# Patient Record
Sex: Male | Born: 1995 | Race: White | Hispanic: No | Marital: Single | Smoking: Never smoker
Health system: Southern US, Community
[De-identification: ages and names within clinical notes are randomized; demographics above are authoritative.]

---
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
## 2017-02-26 DIAGNOSIS — F5105 Insomnia due to other mental disorder: Secondary | ICD-10-CM | POA: Diagnosis not present

## 2017-02-26 DIAGNOSIS — F331 Major depressive disorder, recurrent, moderate: Secondary | ICD-10-CM | POA: Diagnosis not present

## 2017-02-26 DIAGNOSIS — F900 Attention-deficit hyperactivity disorder, predominantly inattentive type: Secondary | ICD-10-CM | POA: Diagnosis not present

## 2017-02-26 DIAGNOSIS — F411 Generalized anxiety disorder: Secondary | ICD-10-CM | POA: Diagnosis not present

## 2017-02-26 DIAGNOSIS — F4011 Social phobia, generalized: Secondary | ICD-10-CM | POA: Diagnosis not present

## 2017-03-03 DIAGNOSIS — F4011 Social phobia, generalized: Secondary | ICD-10-CM | POA: Diagnosis not present

## 2017-03-03 DIAGNOSIS — F5105 Insomnia due to other mental disorder: Secondary | ICD-10-CM | POA: Diagnosis not present

## 2017-03-03 DIAGNOSIS — F39 Unspecified mood [affective] disorder: Secondary | ICD-10-CM | POA: Diagnosis not present

## 2017-03-03 DIAGNOSIS — F1220 Cannabis dependence, uncomplicated: Secondary | ICD-10-CM | POA: Diagnosis not present

## 2017-03-04 DIAGNOSIS — Z79899 Other long term (current) drug therapy: Secondary | ICD-10-CM | POA: Diagnosis not present

## 2017-03-05 DIAGNOSIS — F411 Generalized anxiety disorder: Secondary | ICD-10-CM | POA: Diagnosis not present

## 2017-03-07 DIAGNOSIS — F4011 Social phobia, generalized: Secondary | ICD-10-CM | POA: Diagnosis not present

## 2017-03-07 DIAGNOSIS — F39 Unspecified mood [affective] disorder: Secondary | ICD-10-CM | POA: Diagnosis not present

## 2017-03-07 DIAGNOSIS — F5105 Insomnia due to other mental disorder: Secondary | ICD-10-CM | POA: Diagnosis not present

## 2017-03-07 DIAGNOSIS — F900 Attention-deficit hyperactivity disorder, predominantly inattentive type: Secondary | ICD-10-CM | POA: Diagnosis not present

## 2017-03-12 DIAGNOSIS — F411 Generalized anxiety disorder: Secondary | ICD-10-CM | POA: Diagnosis not present

## 2017-03-13 DIAGNOSIS — F900 Attention-deficit hyperactivity disorder, predominantly inattentive type: Secondary | ICD-10-CM | POA: Diagnosis not present

## 2017-03-13 DIAGNOSIS — F5105 Insomnia due to other mental disorder: Secondary | ICD-10-CM | POA: Diagnosis not present

## 2017-03-13 DIAGNOSIS — F39 Unspecified mood [affective] disorder: Secondary | ICD-10-CM | POA: Diagnosis not present

## 2017-03-13 DIAGNOSIS — F1220 Cannabis dependence, uncomplicated: Secondary | ICD-10-CM | POA: Diagnosis not present

## 2017-03-14 ENCOUNTER — Telehealth (HOSPITAL_COMMUNITY): Payer: Self-pay | Admitting: Psychology

## 2017-03-19 DIAGNOSIS — F411 Generalized anxiety disorder: Secondary | ICD-10-CM | POA: Diagnosis not present

## 2017-03-21 ENCOUNTER — Encounter (HOSPITAL_COMMUNITY): Payer: Self-pay | Admitting: Psychology

## 2017-03-21 ENCOUNTER — Ambulatory Visit (INDEPENDENT_AMBULATORY_CARE_PROVIDER_SITE_OTHER): Payer: BLUE CROSS/BLUE SHIELD | Admitting: Psychology

## 2017-03-21 DIAGNOSIS — F1220 Cannabis dependence, uncomplicated: Secondary | ICD-10-CM

## 2017-03-21 NOTE — Progress Notes (Addendum)
Comprehensive Clinical Assessment (CCA) Note  03/21/2017 Stephen Mcgee [id number]  Visit Diagnosis:  Cannabis Use Disorder, Moderate Mood Disorder,    CCA Part One  Part One has been completed on paper by the patient.  (See scanned document in Chart Review)  CCA Part Two A  Intake/Chief Complaint:  CCA Intake With Chief Complaint CCA Part Two Date: 03/21/17 CCA Part Two Time: 1057 Chief Complaint/Presenting Problem: I started smoking a lot of marijuana in college last year. My use increased in the spring and I basically flunked out of the spring semester. My parents are very upset and worried. I have been seeing Dr. Maryruth Bun at [facility] and meet weekly with an individual counselor. I haven't smoked since February 15 and I am feeling better and sleeping much better. MY parents are afraid I am going to go back to smoking. Patients Currently Reported Symptoms/Problems: I stopped doing a lot of things that I used to do, including working out at [employer] and playing lacrosse. Of course, my grades were pretty bad and I didn't apply myself. Today I am feeling better. I still have depression and sometimes get anxious, but my counselor helps me learn how to handle things better.  Collateral Involvement: The patient signed a consent allowing me to speak with his parents.  Individual's Strengths: Physically healthy, has family support, seems motivated.  Individual's Preferences: to be educated and learn more about learning to stay healthy.  Individual's Abilities: Patient has good social skills, is able to articulate his feelings Type of Services Patient Feels Are Needed: he is receptive to any recommendations Initial Clinical Notes/Concerns: Patient admitted he has always had problems with lying, "it comes second nature to me". Patient is adopted and was told his biological father had an "addictive personality"  Mental Health Symptoms Depression:  Depression: Worthlessness, Hopelessness,  Difficulty Concentrating, Change in energy/activity  Mania:  Mania: N/A  Anxiety:   Anxiety: Worrying, Irritability, Restlessness, Difficulty concentrating  Psychosis:  Psychosis: N/A  Trauma:  Trauma: N/A  Obsessions:  Obsessions: N/A  Compulsions:  Compulsions: N/A  Inattention:  Inattention: N/A  Hyperactivity/Impulsivity:  Hyperactivity/Impulsivity: N/A  Oppositional/Defiant Behaviors:  Oppositional/Defiant Behaviors: N/A  Borderline Personality:  Emotional Irregularity: N/A  Other Mood/Personality Symptoms:      Mental Status Exam Appearance and self-care  Stature:  Stature: Average  Weight:  Weight: Thin  Clothing:  Clothing: Casual  Grooming:  Grooming: Normal  Cosmetic use:  Cosmetic Use: Age appropriate  Posture/gait:  Posture/Gait: Normal  Motor activity:  Motor Activity: Not Remarkable  Sensorium  Attention:  Attention: Normal  Concentration:  Concentration: Normal  Orientation:  Orientation: X5  Recall/memory:  Recall/Memory: Normal  Affect and Mood  Affect:  Affect: Appropriate  Mood:     Relating  Eye contact:  Eye Contact: Normal  Facial expression:  Facial Expression: Responsive  Attitude toward examiner:  Attitude Toward Examiner: Cooperative  Thought and Language  Speech flow: Speech Flow: Normal  Thought content:  Thought Content: Appropriate to mood and circumstances  Preoccupation:     Hallucinations:     Organization:     Company secretary of Knowledge:  Fund of Knowledge: Average  Intelligence:  Intelligence: Average  Abstraction:  Abstraction: Normal  Judgement:  Judgement: Fair  Dance movement psychotherapist:  Reality Testing: Realistic  Insight:  Insight: Fair  Decision Making:  Decision Making: Impulsive  Social Functioning  Social Maturity:  Social Maturity: Impulsive, Isolates  Social Judgement:  Social Judgement: "Chief of Staff", Heedless  Stress  Stressors:  Stressors: Family conflict, Work  Coping Ability:  Coping Ability: Deficient  supports  Skill Deficits:     Supports:      Family and Psychosocial History: Family history Marital status: Single Are you sexually active?: Yes What is your sexual orientation?: Heterosexual Has your sexual activity been affected by drugs, alcohol, medication, or emotional stress?: no Does patient have children?: No  Childhood History:  Childhood History By whom was/is the patient raised?: Adoptive parents Additional childhood history information: I wazs adopted at birth. I was born in [place]. My parents adopted me and brought me to [facility] They were told my birth father had an 'addictive personality' Description of patient's relationship with caregiver when they were a child: I had a great childhood - I had everything I could want Patient's description of current relationship with people who raised him/her: We have a good relationship now. They are concerned I will go back to using.  How were you disciplined when you got in trouble as a child/adolescent?: Usually the threat of a spanking was enough for me to behave. I was punished appropriately Does patient have siblings?: Yes Number of Siblings: 1 Description of patient's current relationship with siblings: It was good, but she has recently moved out of the house and is smoking pot. I have tAaken my parents side and she has cut me off.  Did patient suffer any verbal/emotional/physical/sexual abuse as a child?: No Did patient suffer from severe childhood neglect?: No Has patient ever been sexually abused/assaulted/raped as an adolescent or adult?: No Was the patient ever a victim of a crime or a disaster?: No Witnessed domestic violence?: No Has patient been effected by domestic violence as an adult?: No  CCA Part Two B  Employment/Work Situation: Employment / Work Psychologist, occupational Employment situation: Biomedical scientist job has been impacted by current illness: Yes Describe how patient's job has been impacted: Patient has not sought  employment because he couldn't pass a drug test. He will seek a job once the [facility] has cleared What is the longest time patient has a held a job?: two months  Where was the patient employed at that time?: [employer] Has patient ever been in the [employer]?: No  Education: Engineer, civil (consulting) Currently Attending: N/A Last Grade Completed: 13 Name of High School: [employer] Did Garment/textile technologist From [employer]?: Yes Did You Attend College?: Yes What Type of College Degree Do you Have?: I started at [employer] in the fall of 2017. Only got credit for that first semester because I flunked out the second semester Did You Attend Graduate School?: No What Was Your Major?: computer information systems Did You Have Any Special Interests In School?: no Did You Have An Individualized Education Program (IIEP): No Did You Have Any Difficulty At School?: No  Religion: Religion/Spirituality Are You A Religious Person?: No How Might This Affect Treatment?: But I am kind of spiritual. I do not pray  Leisure/Recreation: Leisure / Recreation Leisure and Hobbies: goingn to the gym, assisting the lacrosse team at my old high school  Exercise/Diet: Exercise/Diet Do You Exercise?: Yes What Type of Exercise Do You Do?: Weight Training, Run/Walk How Many Times a Week Do You Exercise?: 4-5 times a week Have You Gained or Lost A Significant Amount of Weight in the Past Six Months?: No Do You Follow a Special Diet?: No Do You Have Any Trouble Sleeping?: No  CCA Part Two C  Alcohol/Drug Use: Alcohol / Drug Use Pain Medications: N/A Prescriptions: Right  now I am only taking Abilify 10 mg Over the Counter: N/A History of alcohol / drug use?: Yes Longest period of sobriety (when/how long): I was drug free for a month early last summer Negative Consequences of Use: Personal relationships, Work / Programmer, multimedia, Surveyor, quantity Withdrawal Symptoms: Irritability Substance #1 Name of Substance 1: Marijuana 1 - Age of First  Use: 19 1 - Amount (size/oz): over a gram  1 - Frequency: daily 1 - Duration: for a solid year 1 - Last Use / Amount: 03/08/17 couple of hits Substance #2 Name of Substance 2: alcohol 2 - Age of First Use: 19 2 - Amount (size/oz): one or two drinks 2 - Frequency: 1-2 times a month 2 - Duration: about a year 2 - Last Use / Amount: one Christiane Ha and coke on Tuesday evening - at home with dinner. My parents let me have a drink every so often Substance #3 Name of Substance 3: cocaine 3 - Age of First Use: 20 3 - Amount (size/oz): one line of powder 3 - Frequency: I tried it twice when I first got to [employer] in the fall of 2017 3 - Duration: only twice 3 - Last Use / Amount: Fall of 2017                CCA Part Three  ASAM's:  Six Dimensions of Multidimensional Assessment  Dimension 1:  Acute Intoxication and/or Withdrawal Potential:  Dimension 1:  Comments: the patient was smoking heavily last year, but has reduced his use considerably. Patient reports last use was 13 days ago.   Dimension 2:  Biomedical Conditions and Complications:  Dimension 2:  Comments: good physical health  Dimension 3:  Emotional, Behavioral, or Cognitive Conditions and Complications:  Dimension 3:  Comments: Patient is diagnosed with mood disorder and anxiety. He will be meeting with psychiatrist within the week to discuss med management  Dimension 4:  Readiness to Change:  Dimension 4:  Comments: Patient is here because of his parents. His motivation is somewhat questionable  Dimension 5:  Relapse, Continued use, or Continued Problem Potential:  Dimension 5:  Comments: The likelihood for relapse is quite high  Dimension 6:  Recovery/Living Environment:  Dimension 6:  Recovery/Living Environment Comments: Patient lives at home with parents, but they allow him to drink alcohol despite being just 22 yo   Substance use Disorder (SUD) Substance Use Disorder (SUD)  Checklist Symptoms of Substance Use: Continued  use despite having a persistent/recurrent physical/psychological problem caused/exacerbated by use, Continued use despite persistent or recurrent social, interpersonal problems, caused or exacerbated by use, Large amounts of time spent to obtain, use or recover from the substance(s), Recurrent use that results in a fialure to fulfill major rule obligatinos (work, school, home), Social, occupational, recreational activities given up or reduced due to use, Substance(s) often taken in large amounts or over longer times than was intended  Social Function:  Social Functioning Social Maturity: Impulsive, Isolates Social Judgement: "Chief of Staff", Heedless  Stress:  Stress Stressors: Family conflict, Work Coping Ability: Deficient supports Patient Takes Medications The Biochemist, clinical Instructed?: Yes Priority Risk: Low Acuity  Risk Assessment- Self-Harm Potential: Risk Assessment For Self-Harm Potential Thoughts of Self-Harm: No current thoughts Method: No plan Availability of Means: No access/NA Additional Comments for Self-Harm Potential: No history of self-harm  Risk Assessment -Dangerous to Others Potential: Risk Assessment For Dangerous to Others Potential Method: No Plan Availability of Means: No access or NA Intent: Vague intent or NA Notification  Required: No need or identified person Additional Comments for Danger to Others Potential: Not a violent person  DSM5 Diagnoses: There are no active problems to display for this patient.   Patient Centered Plan: Patient is on the following Treatment Plan(s): Attend Aftercare - if continued use, enter CD-IOP.  Recommendations for Services/Supports/Treatments: The patient is currently meeting for weekly individual counseling sessions and being monitored by his psychiatrist, Dr. Maryruth B[place]Kapur, in [place].   Treatment Plan Summary:    Referrals to Alternative Service(s): Referred to Alternative Service(s):   Place:   Date:   Time:     Referred to Alternative Service(s):   Place:   Date:   Time:    Referred to Alternative Service(s):   Place:   Date:   Time:    Referred to Alternative Service(s):   Place:   Date:   Time:     Charmian Muffnn Dalbert Stillings

## 2017-03-25 ENCOUNTER — Telehealth (HOSPITAL_COMMUNITY): Payer: Self-pay | Admitting: Psychology

## 2017-03-25 DIAGNOSIS — F5105 Insomnia due to other mental disorder: Secondary | ICD-10-CM | POA: Diagnosis not present

## 2017-03-25 DIAGNOSIS — F900 Attention-deficit hyperactivity disorder, predominantly inattentive type: Secondary | ICD-10-CM | POA: Diagnosis not present

## 2017-03-25 DIAGNOSIS — F39 Unspecified mood [affective] disorder: Secondary | ICD-10-CM | POA: Diagnosis not present

## 2017-03-25 DIAGNOSIS — F4011 Social phobia, generalized: Secondary | ICD-10-CM | POA: Diagnosis not present

## 2017-03-26 DIAGNOSIS — F411 Generalized anxiety disorder: Secondary | ICD-10-CM | POA: Diagnosis not present

## 2017-03-27 ENCOUNTER — Other Ambulatory Visit (HOSPITAL_COMMUNITY): Payer: BLUE CROSS/BLUE SHIELD | Attending: Psychiatry | Admitting: Psychology

## 2017-03-27 ENCOUNTER — Encounter: Payer: Self-pay | Admitting: Medical

## 2017-03-27 ENCOUNTER — Encounter (HOSPITAL_COMMUNITY): Payer: Self-pay | Admitting: Psychology

## 2017-03-27 VITALS — BP 110/70 | HR 72 | Ht 72.0 in | Wt 170.0 lb

## 2017-03-27 DIAGNOSIS — F341 Dysthymic disorder: Secondary | ICD-10-CM

## 2017-03-27 DIAGNOSIS — F1220 Cannabis dependence, uncomplicated: Secondary | ICD-10-CM | POA: Diagnosis not present

## 2017-03-27 DIAGNOSIS — F428 Other obsessive-compulsive disorder: Secondary | ICD-10-CM

## 2017-03-27 DIAGNOSIS — Z6372 Alcoholism and drug addiction in family: Secondary | ICD-10-CM | POA: Diagnosis not present

## 2017-03-27 DIAGNOSIS — F4320 Adjustment disorder, unspecified: Secondary | ICD-10-CM | POA: Diagnosis not present
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
## 2017-03-27 NOTE — Progress Notes (Signed)
    Daily Group Progress Note  Program: CD-IOP   03/27/2017 Blenda Nicely [id number]  Diagnosis:  Cannabis use disorder, moderate, dependence ([place])  Dysthymia  Adjustment disorder with problems at school  Alcoholism and drug addiction in family  Other obsessive-compulsive disorders - LyingHx of alcoholism/addiction in family([place] Adult Children (Grandchildren) of Alcoholics)Adult children lie when it is just as easy to tell the truth   Sobriety Date: 03/14/17  Group Time: 1-2:30pm  Participation Level: Active  Behavioral Response: Appropriate  Type of Therapy: Process Group  Interventions: Supportive  Topic: Process: The first half of group was spent in process. Members shared about their week since we last met. Their check-ins included any speed bumps or shining moments along with the number of recovery-based meetings they had attended. A new group member was present and he introduced himself to the group in this half of the session. The medical director met with the new member and the member graduating successfully from group tomorrow. Random drug tests were collected from three members.   Group Time: 2:30-4pm  Participation Level: Active  Behavioral Response: Sharing  Type of Therapy: Psycho-Education  Interventions: Strength-based  Topic: Psycho-Ed: The Neurobiology of Addiction. The second half of group was spent in a psycho-ed on the neurobiology of addiction. A brief slide show was presented and then clips from the documentary, "Pleasure Unwoven". A discussion ensued with members providing feedback on the concept of a 'maladaptive coping mechanism'. The session was informative and members responded favorably to the new information and insight the presentation provided them.   Summary: The patient was new to the group and attentive, but very quiet for much of the process session. As this half of group neared the end, the patient was asked to shared about himself.  He identified his sobriety date as 2/21 and went on to describe his growing use of cannabis once he moved to [place] and started college at [employer].  The patient downplayed his drug use and emphasized the anxiety and depression that compelled him to use. The patient received a warm welcome from the group members. In the psycho-ed, the patient met with the medical director. Upon his return to group, he was reserved, but attentive. As the session closed, the patient assured the counselor he would return for the next group session. The patient is young and very new to recovery. He responded well to this first intervention.   UDS collected: Yes, negative  AA/NA attended? No  Sponsor?: No   Brandon Melnick, LCAS 03/27/2017 9:11 PM

## 2017-03-28 ENCOUNTER — Other Ambulatory Visit (HOSPITAL_COMMUNITY): Payer: BLUE CROSS/BLUE SHIELD

## 2017-03-28 ENCOUNTER — Encounter (HOSPITAL_COMMUNITY): Payer: Self-pay | Admitting: Psychology

## 2017-03-28 NOTE — Progress Notes (Signed)
Psychiatric Initial Adult Assessment   Patient Identification: Stephen Mcgee MRN:  [id number] Date of Evaluation: 03/27/2017 3:00 pm Referral Source: Dr Cephus Shelling  MD Psychiatry Chief Complaint:   Chief Complaint    Addiction Problem; Depression; Drug Problem     Visit Diagnosis:    ICD-10-CM   1. Cannabis use disorder, moderate, dependence (HCC) F12.20   2. Dysthymia F34.1   3. Adjustment disorder with problems at school F43.20   4. Alcoholism and drug addiction in family Z52.72   5. Other obsessive-compulsive disorders F42.8    Lying Hx of alcoholism/addiction in family([place] Adult Children (Grandchildren) of Alcoholics) Adult children lie when it is just as easy to tell the truth    History of Present Illness:  22 y/o, adopted at birth [facility] whose biological father is known to him to have an "addictive personality" and whose adoptive family has history of alcohol and  nicotene  dependence, referred by his Psychiatrist Cephus Shelling MD for Cannabis abuse with SUD Severe Dependence by DSM 5 criteria.(6/11 + Criteria)  His problem came to light when he flunked out [employer] his second semester there.Prior to going to [place], on a trip to [place] in 2018  pt had a panic attack and reports was hospitalized (?ED) there before returning home early.  He saw Dr Nicolasa Ducking in [place] and was being treated for Insomnia, ADD, Social phobia ,Mood disorder ,and  Cannabis dependence .With the revelation of his heavy Cannabis use Dr Nicolasa Ducking discontinued his Adderall and started him on Celexa 40 mg ,Abilify 52m (Increased to 10 mg) along with Buspar. Celexa caused worsening mood and he is tapering off of this and beginning Lamictal titer today.He has stopped taking Buspar as well he says.He is having no side effects from the Abilify. He c/o irritability;isolation and loss of interest in usual pleasures.He is having arguments with his parents who are very concerned. Pt met with Counselor  03/21/2017 to discuss treatment options.  His Cannabis and drug use are recorded below in the SA Chart.He reports his only withdrawal symptom is ongoing night sweats that awaken him but he is easily able to go back to sleep.He says he actually feels better not smoking "weed" He is having no problems with the idea of treatment or with his diagnosis of dependence.   Associated Signs/Symptoms: DSM 5 SUD criteria + 6/11 = Severe Dependence AUDIT C score 2 Depression Symptoms:   depressed mood, anhedonia, psychomotor retardation, difficulty concentrating, PHQ 9 score 7 mild depression (Hypo) Manic Symptoms: Substance related  Distractibility, Irritable Mood, Anxiety Symptoms:   Agoraphobia,No Excessive Worry,Yes Panic Symptoms,In past Obsessive Compulsive Symptoms:   None,, Social Anxiety,No Specific Phobias, Psychotic Symptoms:  None PTSD Symptoms: Negative  Past Psychiatric History: ADD                                                 Anxiety with panic                                                 MDD  Previous Psychotropic Medications: Yes Adderall;Celexa;Abilify-started Lamictal titer today  Substance Abuse History in the last 12 months:   Substance Abuse History in the last 12 months:  Substance Age of 1st Use Last Use Amount Specific Type  Nicotine      Alcohol      Cannabis      Opiates      Cocaine      Methamphetamines      LSD      Ecstasy      Benzodiazepines      Caffeine      Inhalants      Others:                          Consequences of Substance Abuse: Medical Consequences:  Anxiety/panic Legal Consequences:  NA-unable to get Employment due to inability to pass preemployment UDS Family Consequences:  Dysfunctional and at timed contentious relationship with parents especially Mother Withdrawal Symptoms:   Diaphoresis  Past Medical History:  ([facility]) . Asthma without status asthmaticus  mild, no inhaler use since 03/2013  . Clavicle fracture, shaft  11/07/2013  left  . Concussion  mild, x2, 7th, 9th grade from lacrosse  . Immunizations up to date   Past Surgical History: ([facility]) Cyst removal from top of mouth     OPEN REDUCTION CLAVICULAR FRACTURE 11/16/2013 Left Procedure: OPEN REDUCTION CLAVICULAR FRACTURE; Surgeon: Opal Sidles III, MD; Location: [place]; Service: Orthopedics; Laterality: Left;  REMOVAL HARDWARE SHOULDER 02/15/2014 Left Procedure: REMOVAL HARDWARE CLAVICLE-Depuy pin ; Surgeon: Kristine Garbe, MD; Location[facility]; Service: Orthopedics; Laterality: Left;    Family Psychiatric History:Biological Father-hx of addiction;Adoptive MGGP hx of alcoholism,MGF Nicotene Dependence  Family History: Adpoted  Social History:   Social History   Socioeconomic History  . Marital status: Single    Spouse name: None  . Number of children: None  . Years of education: 71..5  . Highest education level: 1 semester of [employer]  . Financial resource strain: None  . Food insecurity - worry: None  . Food insecurity - inability: None  . Transportation needs - medical: None  . Transportation needs - non-medical: None  Occupational History  . None  Tobacco Use  . Smoking status: Pot-quit 03/19/2017  . Smokeless tobacco: Never Used  Substance and Sexual Activity  . Alcohol use: 1-2x/month  . Drug use: Cannabis;Cocaine   . Sexual activity: Not asked  Other Topics Concern  . None  Social History Narrative From [facility]  .  Family and Psychosocial History: Family history Marital status: Single Are you sexually active?: Yes What is your sexual orientation?: Heterosexual Has your sexual activity been affected by drugs, alcohol, medication, or emotional stress?: no Does patient have children?: No  Childhood History:  Childhood History By whom was/is the patient raised?: Adoptive parents Additional childhood history information: I wazs adopted at birth. I was born in [place]. My parents adopted me  and brought me to [facility]. They were told my birth father had an 'addictive personality' Description of patient's relationship with caregiver when they were a child: I had a great childhood - I had everything I could want Patient's description of current relationship with people who raised him/her: We have a good relationship now. They are concerned I will go back to using.  How were you disciplined when you got in trouble as a child/adolescent?: Usually the threat of a spanking was enough for me to behave. I was punished appropriately Does patient have siblings?: Yes Number of Siblings: 1 Description of patient's current relationship with siblings: It was good, but she has recently moved out of  the house and is smoking pot. I have tAaken my parents side and she has cut me off.  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 He will seek a job once the [facility] has cleared What is the longest time patient has a held a job?: two months  Where was the patient employed at that time?: [employer] Has patient ever been in the [employer]?: No  Education: Museum/gallery curator Currently Attending: N/A Last Grade Completed: 13 Name of [place]: [place] Did Teacher, adult education From [employer]?: Yes Did [employer]?: Yes What Type of College Degree Do you Have?: I started  at [employer] in the fall of 2017. Only got credit for that first semester because I flunked out the second semester Did [employer]?: No What Was Your Major?: computer information systems Did You Have Any Special Interests In School?: no Did You Have An Individualized Education Program (IIEP): No Did You Have Any Difficulty At School?: No  Religion: Religion/Spirituality Are You A Religious Person?: No How Might This Affect Treatment?: But I am kind of spiritual. I do not pray  Leisure/Recreation: Leisure / Recreation Leisure and Hobbies: goingn to the gym, assisting the lacrosse team at my old high school  Exercise/Diet: Exercise/Diet Do You Exercise?: Yes What Type of Exercise Do You Do?: Weight Training, Run/Walk How Many Times a Week Do You Exercise?: 4-5 times a week Have You Gained or Lost A Significant Amount of Weight in the Past Six Months?: No Do You Follow a Special Diet?: No Do You Have Any Trouble Sleeping?: No     Additional Social History:   Allergies:  Celexa worsened mood disorder  Metabolic Disorder Labs: No results found for: HGBA1C, MPG No results found for: PROLACTIN No results found for: CHOL, TRIG, HDL, CHOLHDL, VLDL, LDLCALC   Current Medications: Current Outpatient Medications  Medication Sig Dispense Refill  . ARIPiprazole (ABILIFY) 10 MG tablet Lamictal 25 mg Take 10 mg by mouth daily. 1 tab week 1, 2 tabs week 2     No current facility-administered medications for this visit.     Neurologic: Headache: Negative Seizure: Negative Paresthesias:Negative  Musculoskeletal: Strength & Muscle Tone: within normal limits Gait & Station: normal Patient leans: N/A  Psychiatric Specialty Exam: Review of Systems  Constitutional: Positive for diaphoresis (Cannabis withdrawal). Negative for chills, fever, malaise/fatigue and weight loss.  HENT: Negative for congestion, ear discharge, ear pain, hearing loss, nosebleeds, sinus pain,  sore throat and tinnitus.   Eyes: Negative for blurred vision, double vision, photophobia, pain, discharge and redness.  Respiratory: Negative for cough, hemoptysis, sputum production, shortness of breath, wheezing and stridor.        Hx of Childhood asthma  Cardiovascular: Negative for chest pain, palpitations, orthopnea, claudication, leg swelling and PND.  Gastrointestinal: Negative for abdominal pain, blood in stool, constipation, diarrhea, heartburn, melena, nausea and vomiting.  Genitourinary: Negative for dysuria, flank pain, frequency and urgency.  Musculoskeletal: Negative for back pain, falls, joint pain, myalgias and neck pain.  Skin: Negative for itching.  Neurological: Negative for dizziness, tingling, tremors, sensory change, speech change, focal weakness, seizures, loss of consciousness, weakness and headaches.  Endo/Heme/Allergies: Negative for environmental allergies and polydipsia. Does not bruise/bleed easily.  Psychiatric/Behavioral: Positive for depression (PHQ 9 + mild) and substance abuse. Negative for hallucinations and suicidal ideas. The patient is nervous/anxious. The patient does not have insomnia.     Blood pressure 110/70, pulse 72, height 6' (1.829 m), weight 170 lb (77.1 kg).Body mass index is 23.06 kg/m.  General Appearance: Casual and Well Groomed  Eye Contact:  Good  Speech:  Clear and Coherent  Volume:  Normal  Mood:  Euthymic  Affect:  Congruent  Thought Process:  Coherent and Descriptions of Associations: Intact  Orientation:  Full (Time, Place, and Person)  Thought Content:  Logical and denies Obsession with Cannabis at present  Suicidal Thoughts:  No  Homicidal Thoughts:  No  Memory:  Negative  Judgement:  Impaired  Insight:  Lacking  Psychomotor Activity:  Normal  Concentration:  Concentration: intact for visit and Attention Span: intact for visit  Recall:  Good  Fund of Knowledge:Fair  Language: Fair  Akathisia:  NA  Handed:  Right  AIMS  (if indicated):  NA  Assets:  Desire for Improvement Financial Resources/Insurance Housing Physical Health Resilience Social Support Transportation  ADL's:  Intact  Cognition: Impaired,  Moderate 13 days s/p last use  Sleep:  Denies problem    Treatment Plan Summary: Treatment Plan/Recommendations:  Plan of Care: CD[facility]OP Cannabis Dependence see Counselor's individualized treatment plan- MAT  Pt will advise if cravings develop  Mood Disorder: per Dr Nicolasa Ducking and monitor in treatment.   Laboratory:  UDS per protocol  Psychotherapy: [facility] IOP Group,Individual and Family  Medications:Per Dr Nicolasa Ducking See list /MAT on hold for now  Routine PRN Medications:  No  Consultations: None at this time  Safety Concerns: None at this time  Other:       Darlyne Russian, PA-C

## 2017-04-01 ENCOUNTER — Other Ambulatory Visit (HOSPITAL_COMMUNITY): Payer: BLUE CROSS/BLUE SHIELD | Admitting: Psychology

## 2017-04-02 DIAGNOSIS — F411 Generalized anxiety disorder: Secondary | ICD-10-CM | POA: Diagnosis not present

## 2017-04-03 ENCOUNTER — Other Ambulatory Visit (HOSPITAL_COMMUNITY): Payer: BLUE CROSS/BLUE SHIELD

## 2017-04-03 ENCOUNTER — Telehealth (HOSPITAL_COMMUNITY): Payer: Self-pay | Admitting: Psychology

## 2017-04-04 ENCOUNTER — Other Ambulatory Visit (HOSPITAL_COMMUNITY): Payer: BLUE CROSS/BLUE SHIELD

## 2017-04-08 ENCOUNTER — Other Ambulatory Visit (HOSPITAL_COMMUNITY): Payer: BLUE CROSS/BLUE SHIELD

## 2017-04-09 ENCOUNTER — Telehealth (HOSPITAL_COMMUNITY): Payer: Self-pay | Admitting: Psychology

## 2017-04-09 DIAGNOSIS — F411 Generalized anxiety disorder: Secondary | ICD-10-CM | POA: Diagnosis not present

## 2017-04-10 ENCOUNTER — Other Ambulatory Visit (HOSPITAL_COMMUNITY): Payer: BLUE CROSS/BLUE SHIELD

## 2017-04-11 ENCOUNTER — Other Ambulatory Visit (HOSPITAL_COMMUNITY): Payer: BLUE CROSS/BLUE SHIELD

## 2017-04-15 ENCOUNTER — Other Ambulatory Visit (HOSPITAL_COMMUNITY): Payer: BLUE CROSS/BLUE SHIELD

## 2017-04-15 DIAGNOSIS — F411 Generalized anxiety disorder: Secondary | ICD-10-CM | POA: Diagnosis not present

## 2017-04-16 DIAGNOSIS — F39 Unspecified mood [affective] disorder: Secondary | ICD-10-CM | POA: Diagnosis not present

## 2017-04-16 DIAGNOSIS — F4011 Social phobia, generalized: Secondary | ICD-10-CM | POA: Diagnosis not present

## 2017-04-16 DIAGNOSIS — F900 Attention-deficit hyperactivity disorder, predominantly inattentive type: Secondary | ICD-10-CM | POA: Diagnosis not present

## 2017-04-16 DIAGNOSIS — F5105 Insomnia due to other mental disorder: Secondary | ICD-10-CM | POA: Diagnosis not present

## 2017-04-17 ENCOUNTER — Other Ambulatory Visit (HOSPITAL_COMMUNITY): Payer: BLUE CROSS/BLUE SHIELD

## 2017-04-18 ENCOUNTER — Other Ambulatory Visit (HOSPITAL_COMMUNITY): Payer: BLUE CROSS/BLUE SHIELD

## 2017-04-22 ENCOUNTER — Other Ambulatory Visit (HOSPITAL_COMMUNITY): Payer: BLUE CROSS/BLUE SHIELD

## 2017-04-24 ENCOUNTER — Other Ambulatory Visit (HOSPITAL_COMMUNITY): Payer: BLUE CROSS/BLUE SHIELD

## 2017-04-24 DIAGNOSIS — F411 Generalized anxiety disorder: Secondary | ICD-10-CM | POA: Diagnosis not present

## 2017-04-25 ENCOUNTER — Other Ambulatory Visit (HOSPITAL_COMMUNITY): Payer: BLUE CROSS/BLUE SHIELD

## 2017-04-29 ENCOUNTER — Other Ambulatory Visit (HOSPITAL_COMMUNITY): Payer: BLUE CROSS/BLUE SHIELD

## 2017-05-01 ENCOUNTER — Other Ambulatory Visit (HOSPITAL_COMMUNITY): Payer: BLUE CROSS/BLUE SHIELD

## 2017-05-01 DIAGNOSIS — F411 Generalized anxiety disorder: Secondary | ICD-10-CM | POA: Diagnosis not present

## 2017-05-02 ENCOUNTER — Other Ambulatory Visit (HOSPITAL_COMMUNITY): Payer: BLUE CROSS/BLUE SHIELD

## 2017-05-06 ENCOUNTER — Other Ambulatory Visit (HOSPITAL_COMMUNITY): Payer: BLUE CROSS/BLUE SHIELD

## 2017-05-08 ENCOUNTER — Other Ambulatory Visit (HOSPITAL_COMMUNITY): Payer: BLUE CROSS/BLUE SHIELD

## 2017-05-08 DIAGNOSIS — F411 Generalized anxiety disorder: Secondary | ICD-10-CM | POA: Diagnosis not present

## 2017-05-09 ENCOUNTER — Other Ambulatory Visit (HOSPITAL_COMMUNITY): Payer: BLUE CROSS/BLUE SHIELD

## 2017-05-13 ENCOUNTER — Other Ambulatory Visit (HOSPITAL_COMMUNITY): Payer: BLUE CROSS/BLUE SHIELD

## 2017-05-15 ENCOUNTER — Other Ambulatory Visit (HOSPITAL_COMMUNITY): Payer: BLUE CROSS/BLUE SHIELD

## 2017-05-15 DIAGNOSIS — F4011 Social phobia, generalized: Secondary | ICD-10-CM | POA: Diagnosis not present

## 2017-05-15 DIAGNOSIS — F331 Major depressive disorder, recurrent, moderate: Secondary | ICD-10-CM | POA: Diagnosis not present

## 2017-05-15 DIAGNOSIS — F900 Attention-deficit hyperactivity disorder, predominantly inattentive type: Secondary | ICD-10-CM | POA: Diagnosis not present

## 2017-05-15 DIAGNOSIS — F5105 Insomnia due to other mental disorder: Secondary | ICD-10-CM | POA: Diagnosis not present

## 2017-05-16 ENCOUNTER — Other Ambulatory Visit (HOSPITAL_COMMUNITY): Payer: BLUE CROSS/BLUE SHIELD

## 2017-05-20 ENCOUNTER — Other Ambulatory Visit (HOSPITAL_COMMUNITY): Payer: BLUE CROSS/BLUE SHIELD

## 2017-05-22 ENCOUNTER — Other Ambulatory Visit (HOSPITAL_COMMUNITY): Payer: BLUE CROSS/BLUE SHIELD

## 2017-05-22 DIAGNOSIS — F411 Generalized anxiety disorder: Secondary | ICD-10-CM | POA: Diagnosis not present

## 2017-05-23 ENCOUNTER — Other Ambulatory Visit (HOSPITAL_COMMUNITY): Payer: BLUE CROSS/BLUE SHIELD

## 2017-05-27 ENCOUNTER — Other Ambulatory Visit (HOSPITAL_COMMUNITY): Payer: BLUE CROSS/BLUE SHIELD

## 2017-05-29 ENCOUNTER — Other Ambulatory Visit (HOSPITAL_COMMUNITY): Payer: BLUE CROSS/BLUE SHIELD

## 2017-05-30 ENCOUNTER — Other Ambulatory Visit (HOSPITAL_COMMUNITY): Payer: BLUE CROSS/BLUE SHIELD

## 2017-06-03 ENCOUNTER — Other Ambulatory Visit (HOSPITAL_COMMUNITY): Payer: BLUE CROSS/BLUE SHIELD

## 2017-06-05 ENCOUNTER — Other Ambulatory Visit (HOSPITAL_COMMUNITY): Payer: BLUE CROSS/BLUE SHIELD

## 2017-06-06 ENCOUNTER — Other Ambulatory Visit (HOSPITAL_COMMUNITY): Payer: BLUE CROSS/BLUE SHIELD

## 2017-06-10 ENCOUNTER — Other Ambulatory Visit (HOSPITAL_COMMUNITY): Payer: BLUE CROSS/BLUE SHIELD

## 2017-06-12 ENCOUNTER — Other Ambulatory Visit (HOSPITAL_COMMUNITY): Payer: BLUE CROSS/BLUE SHIELD

## 2017-06-12 DIAGNOSIS — F411 Generalized anxiety disorder: Secondary | ICD-10-CM | POA: Diagnosis not present

## 2017-06-12 DIAGNOSIS — F39 Unspecified mood [affective] disorder: Secondary | ICD-10-CM | POA: Diagnosis not present

## 2017-06-12 DIAGNOSIS — F900 Attention-deficit hyperactivity disorder, predominantly inattentive type: Secondary | ICD-10-CM | POA: Diagnosis not present

## 2017-06-12 DIAGNOSIS — F5105 Insomnia due to other mental disorder: Secondary | ICD-10-CM | POA: Diagnosis not present

## 2017-06-12 DIAGNOSIS — F4011 Social phobia, generalized: Secondary | ICD-10-CM | POA: Diagnosis not present

## 2017-06-13 ENCOUNTER — Other Ambulatory Visit (HOSPITAL_COMMUNITY): Payer: BLUE CROSS/BLUE SHIELD

## 2017-06-19 ENCOUNTER — Other Ambulatory Visit (HOSPITAL_COMMUNITY): Payer: BLUE CROSS/BLUE SHIELD

## 2017-06-20 ENCOUNTER — Other Ambulatory Visit (HOSPITAL_COMMUNITY): Payer: BLUE CROSS/BLUE SHIELD

## 2017-06-24 ENCOUNTER — Other Ambulatory Visit (HOSPITAL_COMMUNITY): Payer: BLUE CROSS/BLUE SHIELD

## 2017-06-26 ENCOUNTER — Other Ambulatory Visit (HOSPITAL_COMMUNITY): Payer: BLUE CROSS/BLUE SHIELD

## 2017-07-02 DIAGNOSIS — F411 Generalized anxiety disorder: Secondary | ICD-10-CM | POA: Diagnosis not present

## 2017-07-29 DIAGNOSIS — F5105 Insomnia due to other mental disorder: Secondary | ICD-10-CM | POA: Diagnosis not present

## 2017-07-29 DIAGNOSIS — F4011 Social phobia, generalized: Secondary | ICD-10-CM | POA: Diagnosis not present

## 2017-07-29 DIAGNOSIS — F900 Attention-deficit hyperactivity disorder, predominantly inattentive type: Secondary | ICD-10-CM | POA: Diagnosis not present

## 2017-07-29 DIAGNOSIS — F39 Unspecified mood [affective] disorder: Secondary | ICD-10-CM | POA: Diagnosis not present

## 2017-07-30 DIAGNOSIS — F411 Generalized anxiety disorder: Secondary | ICD-10-CM | POA: Diagnosis not present

## 2017-08-06 DIAGNOSIS — F411 Generalized anxiety disorder: Secondary | ICD-10-CM | POA: Diagnosis not present

## 2017-08-20 DIAGNOSIS — F411 Generalized anxiety disorder: Secondary | ICD-10-CM | POA: Diagnosis not present

## 2017-08-30 DIAGNOSIS — F900 Attention-deficit hyperactivity disorder, predominantly inattentive type: Secondary | ICD-10-CM | POA: Diagnosis not present

## 2017-08-30 DIAGNOSIS — F39 Unspecified mood [affective] disorder: Secondary | ICD-10-CM | POA: Diagnosis not present

## 2017-08-30 DIAGNOSIS — F4011 Social phobia, generalized: Secondary | ICD-10-CM | POA: Diagnosis not present

## 2017-08-30 DIAGNOSIS — F5105 Insomnia due to other mental disorder: Secondary | ICD-10-CM | POA: Diagnosis not present

## 2017-09-17 DIAGNOSIS — F411 Generalized anxiety disorder: Secondary | ICD-10-CM | POA: Diagnosis not present

## 2017-10-15 DIAGNOSIS — F411 Generalized anxiety disorder: Secondary | ICD-10-CM | POA: Diagnosis not present

## 2017-11-01 DIAGNOSIS — F5105 Insomnia due to other mental disorder: Secondary | ICD-10-CM | POA: Diagnosis not present

## 2017-11-01 DIAGNOSIS — F1211 Cannabis abuse, in remission: Secondary | ICD-10-CM | POA: Diagnosis not present

## 2017-11-01 DIAGNOSIS — F900 Attention-deficit hyperactivity disorder, predominantly inattentive type: Secondary | ICD-10-CM | POA: Diagnosis not present

## 2017-11-01 DIAGNOSIS — F39 Unspecified mood [affective] disorder: Secondary | ICD-10-CM | POA: Diagnosis not present

## 2017-11-19 DIAGNOSIS — F411 Generalized anxiety disorder: Secondary | ICD-10-CM | POA: Diagnosis not present

## 2017-12-05 DIAGNOSIS — F411 Generalized anxiety disorder: Secondary | ICD-10-CM | POA: Diagnosis not present

## 2017-12-10 DIAGNOSIS — F411 Generalized anxiety disorder: Secondary | ICD-10-CM | POA: Diagnosis not present

## 2017-12-30 DIAGNOSIS — F411 Generalized anxiety disorder: Secondary | ICD-10-CM | POA: Diagnosis not present

## 2018-01-21 DIAGNOSIS — F39 Unspecified mood [affective] disorder: Secondary | ICD-10-CM | POA: Diagnosis not present

## 2018-01-21 DIAGNOSIS — F5105 Insomnia due to other mental disorder: Secondary | ICD-10-CM | POA: Diagnosis not present

## 2018-01-21 DIAGNOSIS — F1211 Cannabis abuse, in remission: Secondary | ICD-10-CM | POA: Diagnosis not present

## 2018-01-21 DIAGNOSIS — F900 Attention-deficit hyperactivity disorder, predominantly inattentive type: Secondary | ICD-10-CM | POA: Diagnosis not present

## 2018-01-29 DIAGNOSIS — F411 Generalized anxiety disorder: Secondary | ICD-10-CM | POA: Diagnosis not present

## 2018-02-05 DIAGNOSIS — H6981 Other specified disorders of Eustachian tube, right ear: Secondary | ICD-10-CM | POA: Diagnosis not present

## 2018-03-11 DIAGNOSIS — F411 Generalized anxiety disorder: Secondary | ICD-10-CM | POA: Diagnosis not present

## 2018-04-25 DIAGNOSIS — F5105 Insomnia due to other mental disorder: Secondary | ICD-10-CM | POA: Diagnosis not present

## 2018-04-25 DIAGNOSIS — F4011 Social phobia, generalized: Secondary | ICD-10-CM | POA: Diagnosis not present

## 2018-04-25 DIAGNOSIS — F39 Unspecified mood [affective] disorder: Secondary | ICD-10-CM | POA: Diagnosis not present

## 2018-04-25 DIAGNOSIS — F900 Attention-deficit hyperactivity disorder, predominantly inattentive type: Secondary | ICD-10-CM | POA: Diagnosis not present

## 2018-05-28 DIAGNOSIS — F411 Generalized anxiety disorder: Secondary | ICD-10-CM | POA: Diagnosis not present

## 2018-07-18 DIAGNOSIS — F900 Attention-deficit hyperactivity disorder, predominantly inattentive type: Secondary | ICD-10-CM | POA: Diagnosis not present

## 2018-07-18 DIAGNOSIS — F4011 Social phobia, generalized: Secondary | ICD-10-CM | POA: Diagnosis not present

## 2018-07-18 DIAGNOSIS — F5105 Insomnia due to other mental disorder: Secondary | ICD-10-CM | POA: Diagnosis not present

## 2018-07-18 DIAGNOSIS — F39 Unspecified mood [affective] disorder: Secondary | ICD-10-CM | POA: Diagnosis not present

## 2018-07-29 DIAGNOSIS — Z20828 Contact with and (suspected) exposure to other viral communicable diseases: Secondary | ICD-10-CM | POA: Diagnosis not present

## 2018-10-31 DIAGNOSIS — F5105 Insomnia due to other mental disorder: Secondary | ICD-10-CM | POA: Diagnosis not present

## 2018-10-31 DIAGNOSIS — F39 Unspecified mood [affective] disorder: Secondary | ICD-10-CM | POA: Diagnosis not present

## 2018-10-31 DIAGNOSIS — F900 Attention-deficit hyperactivity disorder, predominantly inattentive type: Secondary | ICD-10-CM | POA: Diagnosis not present

## 2018-10-31 DIAGNOSIS — F4011 Social phobia, generalized: Secondary | ICD-10-CM | POA: Diagnosis not present

## 2018-12-15 DIAGNOSIS — M25562 Pain in left knee: Secondary | ICD-10-CM | POA: Diagnosis not present

## 2018-12-17 DIAGNOSIS — M2342 Loose body in knee, left knee: Secondary | ICD-10-CM | POA: Diagnosis not present

## 2018-12-17 DIAGNOSIS — M25462 Effusion, left knee: Secondary | ICD-10-CM | POA: Diagnosis not present

## 2018-12-17 DIAGNOSIS — M25562 Pain in left knee: Secondary | ICD-10-CM | POA: Diagnosis not present

## 2018-12-23 ENCOUNTER — Other Ambulatory Visit: Payer: Self-pay | Admitting: Orthopedic Surgery

## 2018-12-23 DIAGNOSIS — M25462 Effusion, left knee: Secondary | ICD-10-CM

## 2018-12-23 DIAGNOSIS — M2342 Loose body in knee, left knee: Secondary | ICD-10-CM

## 2018-12-23 DIAGNOSIS — M25562 Pain in left knee: Secondary | ICD-10-CM

## 2018-12-24 ENCOUNTER — Other Ambulatory Visit: Payer: Self-pay

## 2018-12-24 ENCOUNTER — Ambulatory Visit
Admission: RE | Admit: 2018-12-24 | Discharge: 2018-12-24 | Disposition: A | Discharge status: Home / Self Care | Payer: BC Managed Care – PPO | Source: Ambulatory Visit | Attending: Orthopedic Surgery | Admitting: Orthopedic Surgery

## 2018-12-24 DIAGNOSIS — M25462 Effusion, left knee: Secondary | ICD-10-CM | POA: Diagnosis not present

## 2018-12-24 DIAGNOSIS — M2342 Loose body in knee, left knee: Secondary | ICD-10-CM | POA: Diagnosis not present

## 2018-12-24 DIAGNOSIS — S83512A Sprain of anterior cruciate ligament of left knee, initial encounter: Secondary | ICD-10-CM | POA: Diagnosis not present

## 2018-12-24 DIAGNOSIS — M25562 Pain in left knee: Secondary | ICD-10-CM | POA: Diagnosis not present

## 2018-12-24 IMAGING — MR MR KNEE*L* W/O CM
6 series · 40 of 40 positions shown · non-contrast
Comparison: None.

CLINICAL DATA: Two weeks of knee pain

EXAM:
MRI OF THE LEFT KNEE WITHOUT CONTRAST
TECHNIQUE: Multiplanar, multisequence MR imaging of the knee was performed. No
intravenous contrast was administered.

[Series 8: T2 fat-sat · axial · left · 4.0mm · 0.50mm/px · z∈[-58,+76]mm · 5 of 28 slices shown (1 of 3)]
[im 1/28]
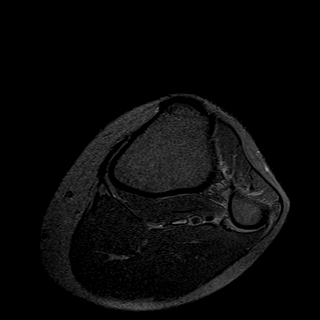
[im 7/28]
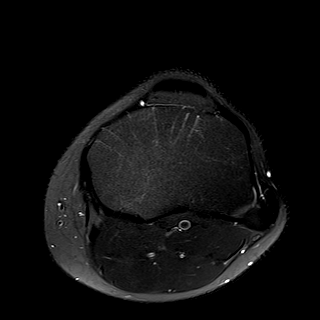
[im 14/28]
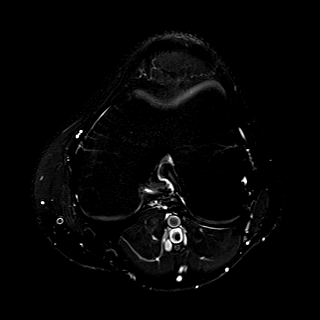
[im 21/28]
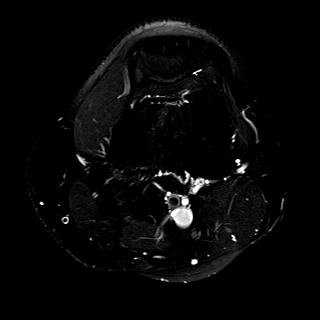
[im 28/28]
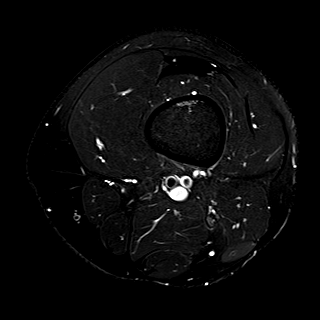

[Series 9: T1 · coronal · left · 4.0mm · 0.42mm/px · 7 of 32 slices shown]
[im 1/32]
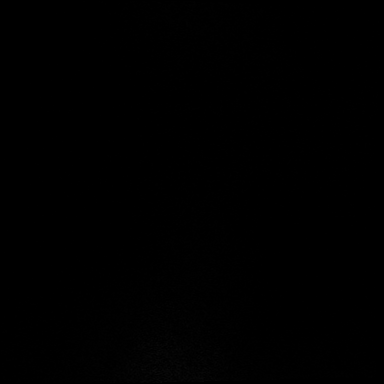
[im 6/32]
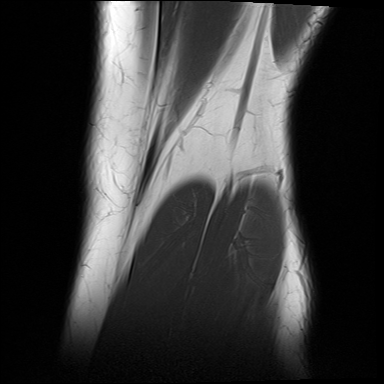
[im 11/32]
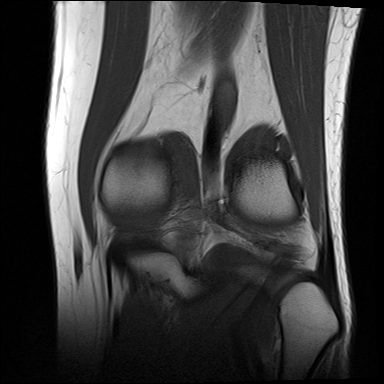
[im 16/32]
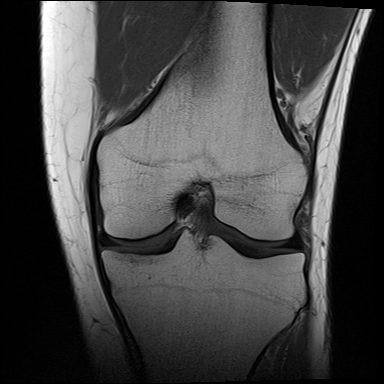
[im 21/32]
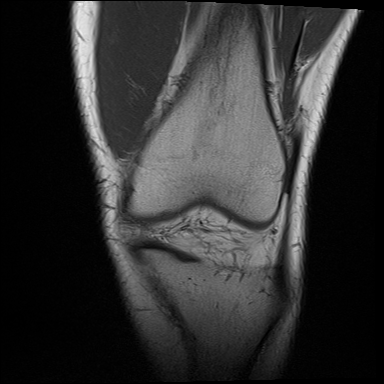
[im 26/32]
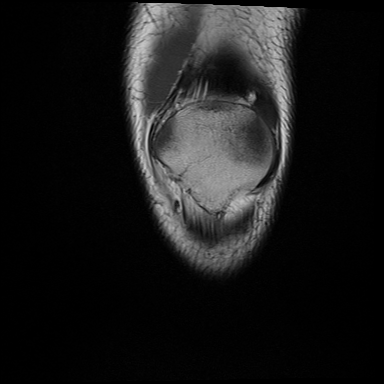
[im 32/32]
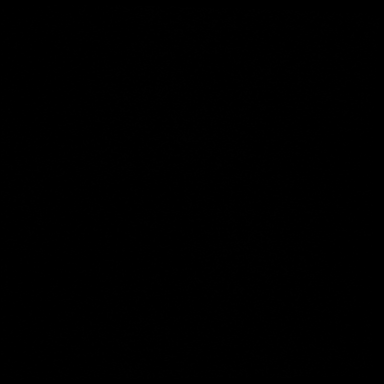

[Series 10: T2 fat-sat · coronal · left · 4.0mm · 0.59mm/px · 6 of 30 slices shown (2 of 3)]
[im 1/30]
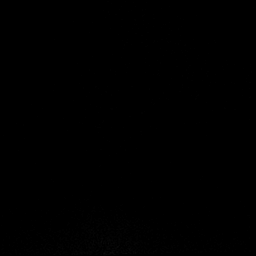
[im 6/30]
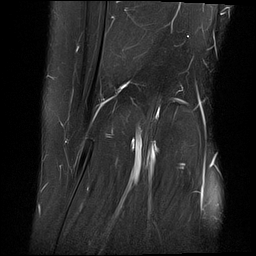
[im 12/30]
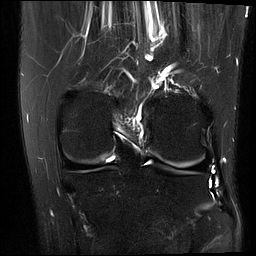
[im 18/30]
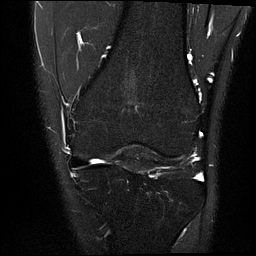
[im 24/30]
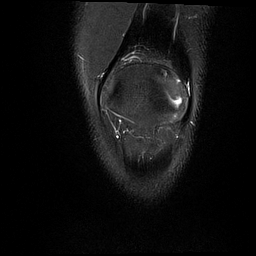
[im 30/30]
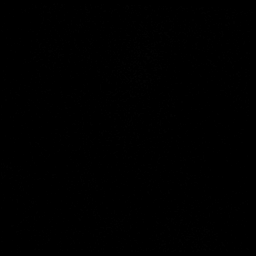

[Series 11: PD fat-sat · coronal · left · 4.0mm · 0.59mm/px · 7 of 31 slices shown (1 of 2)]
[im 1/31]
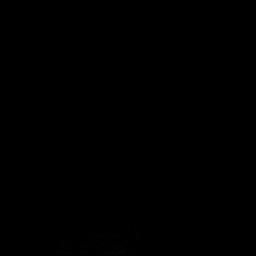
[im 6/31]
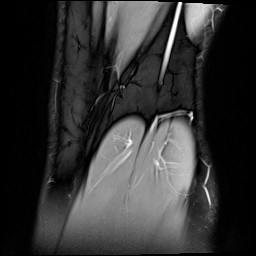
[im 11/31]
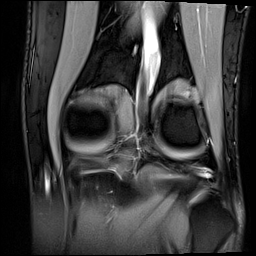
[im 16/31]
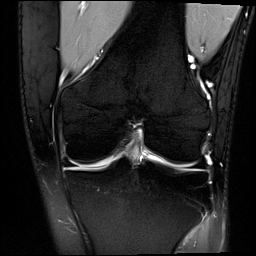
[im 21/31]
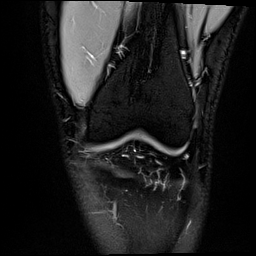
[im 26/31]
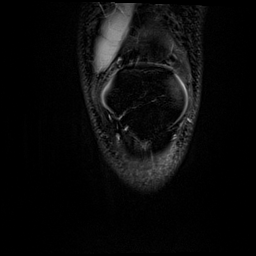
[im 31/31]
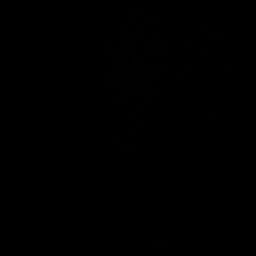

[Series 12: PD fat-sat · sagittal · left · 3.0mm · 0.66mm/px · 7 of 32 slices shown (2 of 2)]
[im 1/32]
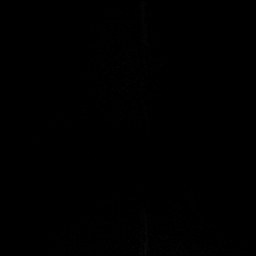
[im 6/32]
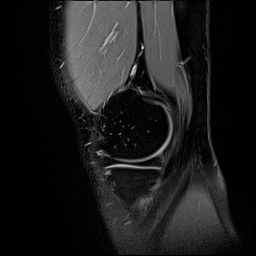
[im 11/32]
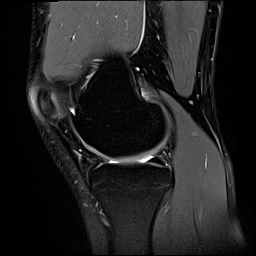
[im 16/32]
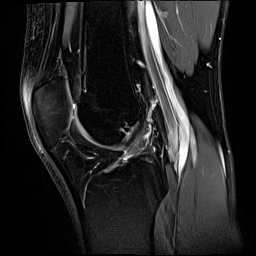
[im 21/32]
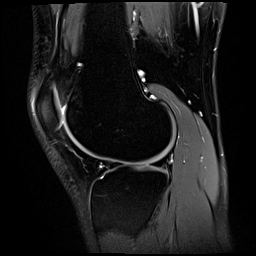
[im 26/32]
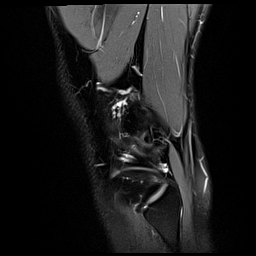
[im 32/32]
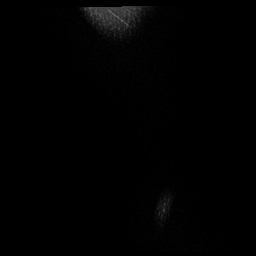

[Series 13: T2 fat-sat · sagittal · left · 3.0mm · 0.66mm/px · 8 of 36 slices shown (3 of 3)]
[im 1/36]
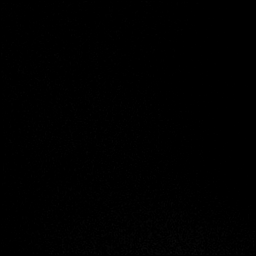
[im 6/36]
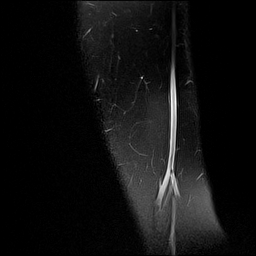
[im 11/36]
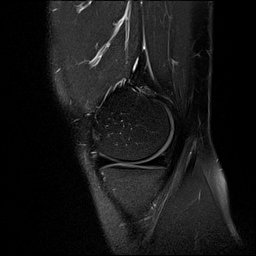
[im 16/36]
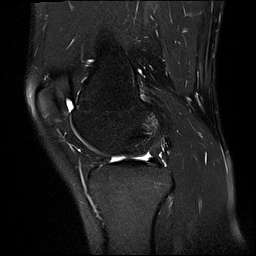
[im 21/36]
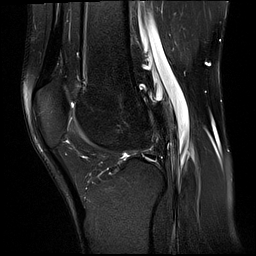
[im 26/36]
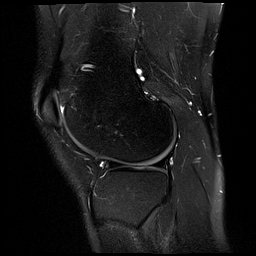
[im 31/36]
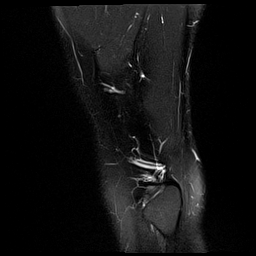
[im 36/36]
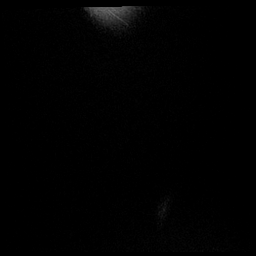

[40 of 40 positions shown; findings below may reference images not displayed]

FINDINGS: MENISCI

Medial: Intact.

Lateral: Intact.

LIGAMENTS

Cruciates: Mildly increased signal seen within the ACL, however it
is intact. The PCL is intact.

Collaterals: The MCL is intact. The lateral collateral ligamentous
complex is intact.

CARTILAGE

Patellofemoral: Normal.

Medial compartment: There is minimal chondral fissuring seen in the
weight-bearing surface of the medial femoral condyle.

Lateral compartment: Normal.

BONES: No fracture. No avascular necrosis. No pathologic marrow
infiltration.

JOINT: No joint effusion. Normal AUJLA. No plical
thickening.

EXTENSOR MECHANISM: The patellar and quadriceps tendon are intact.
The retinaculum is unremarkable.

POPLITEAL FOSSA: No popliteal cyst.

OTHER:  The visualized muscles are normal in appearance.
IMPRESSION: Mild intrasubstance sprain of the ACL, however it is intact.

Intact menisci.

Mild chondral disease within the medial compartment.

## 2018-12-27 ENCOUNTER — Ambulatory Visit: Payer: Self-pay

## 2018-12-30 DIAGNOSIS — M25562 Pain in left knee: Secondary | ICD-10-CM | POA: Diagnosis not present

## 2019-01-06 DIAGNOSIS — M25562 Pain in left knee: Secondary | ICD-10-CM | POA: Diagnosis not present

## 2019-01-09 DIAGNOSIS — M25562 Pain in left knee: Secondary | ICD-10-CM | POA: Diagnosis not present

## 2019-01-13 DIAGNOSIS — M25562 Pain in left knee: Secondary | ICD-10-CM | POA: Diagnosis not present

## 2019-01-20 DIAGNOSIS — M25562 Pain in left knee: Secondary | ICD-10-CM | POA: Diagnosis not present

## 2019-01-26 DIAGNOSIS — M25562 Pain in left knee: Secondary | ICD-10-CM | POA: Diagnosis not present

## 2019-01-29 DIAGNOSIS — M25562 Pain in left knee: Secondary | ICD-10-CM | POA: Diagnosis not present

## 2019-02-03 DIAGNOSIS — M25562 Pain in left knee: Secondary | ICD-10-CM | POA: Diagnosis not present

## 2019-02-05 DIAGNOSIS — M25562 Pain in left knee: Secondary | ICD-10-CM | POA: Diagnosis not present

## 2019-02-11 DIAGNOSIS — F39 Unspecified mood [affective] disorder: Secondary | ICD-10-CM | POA: Diagnosis not present

## 2019-02-11 DIAGNOSIS — S86292A Other injury of muscle(s) and tendon(s) of anterior muscle group at lower leg level, left leg, initial encounter: Secondary | ICD-10-CM | POA: Diagnosis not present

## 2019-02-11 DIAGNOSIS — F4011 Social phobia, generalized: Secondary | ICD-10-CM | POA: Diagnosis not present

## 2019-02-11 DIAGNOSIS — F5105 Insomnia due to other mental disorder: Secondary | ICD-10-CM | POA: Diagnosis not present

## 2019-02-11 DIAGNOSIS — F900 Attention-deficit hyperactivity disorder, predominantly inattentive type: Secondary | ICD-10-CM | POA: Diagnosis not present

## 2019-02-11 DIAGNOSIS — M25562 Pain in left knee: Secondary | ICD-10-CM | POA: Diagnosis not present

## 2019-02-12 DIAGNOSIS — M25562 Pain in left knee: Secondary | ICD-10-CM | POA: Diagnosis not present

## 2019-02-12 DIAGNOSIS — R29898 Other symptoms and signs involving the musculoskeletal system: Secondary | ICD-10-CM | POA: Diagnosis not present

## 2019-02-12 DIAGNOSIS — M25662 Stiffness of left knee, not elsewhere classified: Secondary | ICD-10-CM | POA: Diagnosis not present

## 2019-02-17 DIAGNOSIS — M25562 Pain in left knee: Secondary | ICD-10-CM | POA: Diagnosis not present

## 2019-02-19 DIAGNOSIS — M25562 Pain in left knee: Secondary | ICD-10-CM | POA: Diagnosis not present

## 2019-02-24 DIAGNOSIS — M25562 Pain in left knee: Secondary | ICD-10-CM | POA: Diagnosis not present

## 2019-02-26 DIAGNOSIS — M25562 Pain in left knee: Secondary | ICD-10-CM | POA: Diagnosis not present

## 2019-03-05 DIAGNOSIS — M25562 Pain in left knee: Secondary | ICD-10-CM | POA: Diagnosis not present

## 2019-03-10 DIAGNOSIS — M25562 Pain in left knee: Secondary | ICD-10-CM | POA: Diagnosis not present

## 2019-03-17 DIAGNOSIS — M25562 Pain in left knee: Secondary | ICD-10-CM | POA: Diagnosis not present

## 2019-03-19 DIAGNOSIS — M25562 Pain in left knee: Secondary | ICD-10-CM | POA: Diagnosis not present

## 2019-03-24 DIAGNOSIS — M25562 Pain in left knee: Secondary | ICD-10-CM | POA: Diagnosis not present

## 2019-03-26 DIAGNOSIS — M25562 Pain in left knee: Secondary | ICD-10-CM | POA: Diagnosis not present

## 2019-03-31 DIAGNOSIS — M25562 Pain in left knee: Secondary | ICD-10-CM | POA: Diagnosis not present

## 2019-04-03 DIAGNOSIS — M25562 Pain in left knee: Secondary | ICD-10-CM | POA: Diagnosis not present

## 2019-04-06 DIAGNOSIS — F411 Generalized anxiety disorder: Secondary | ICD-10-CM | POA: Diagnosis not present

## 2019-04-07 DIAGNOSIS — M25562 Pain in left knee: Secondary | ICD-10-CM | POA: Diagnosis not present

## 2019-04-08 DIAGNOSIS — M25562 Pain in left knee: Secondary | ICD-10-CM | POA: Diagnosis not present

## 2019-04-10 ENCOUNTER — Ambulatory Visit: Payer: BC Managed Care – PPO | Attending: Internal Medicine

## 2019-04-10 DIAGNOSIS — Z23 Encounter for immunization: Secondary | ICD-10-CM

## 2019-04-10 DIAGNOSIS — M25562 Pain in left knee: Secondary | ICD-10-CM | POA: Diagnosis not present

## 2019-04-10 NOTE — Progress Notes (Signed)
   Covid-19 Vaccination Clinic  Name:  Stephen Mcgee    MRN: [id number] DOB: 1995/12/01  04/10/2019  Mr. Brainerd was observed post Covid-19 immunization for 15 minutes without incident. He was provided with Vaccine Information Sheet and instruction to access the V-Safe system.   Mr. Sobol was instructed to call 911 with any severe reactions post vaccine: Marland Kitchen Difficulty breathing  . Swelling of face and throat  . A fast heartbeat  . A bad rash all over body  . Dizziness and weakness   Immunizations Administered    Name Date Dose VIS Date Route   Pfizer COVID-19 Vaccine 04/10/2019  5:16 PM 0.3 mL 01/02/2019 Intramuscular   Manufacturer: [employer], [employer]   Lot: [id number]   NDC: [address]-2

## 2019-04-14 DIAGNOSIS — M25562 Pain in left knee: Secondary | ICD-10-CM | POA: Diagnosis not present

## 2019-04-16 DIAGNOSIS — M25562 Pain in left knee: Secondary | ICD-10-CM | POA: Diagnosis not present

## 2019-04-21 DIAGNOSIS — M25562 Pain in left knee: Secondary | ICD-10-CM | POA: Diagnosis not present

## 2019-04-30 DIAGNOSIS — M25562 Pain in left knee: Secondary | ICD-10-CM | POA: Diagnosis not present

## 2019-05-01 ENCOUNTER — Ambulatory Visit: Payer: BC Managed Care – PPO | Attending: Internal Medicine

## 2019-05-01 DIAGNOSIS — Z23 Encounter for immunization: Secondary | ICD-10-CM

## 2019-05-01 NOTE — Progress Notes (Signed)
   Covid-19 Vaccination Clinic  Name:  Stephen Mcgee    MRN: [id number] DOB: 04-Feb-1995  05/01/2019  [name] was observed post Covid-19 immunization for 15 minutes without incident. He was provided with Vaccine Information Sheet and instruction to access the V-Safe system.   [name] was instructed to call 911 with any severe reactions post vaccine: Marland Kitchen Difficulty breathing  . Swelling of face and throat  . A fast heartbeat  . A bad rash all over body  . Dizziness and weakness   Immunizations Administered    Name Date Dose VIS Date Route   Pfizer COVID-19 Vaccine 05/01/2019  3:48 PM 0.3 mL 01/02/2019 Intramuscular   Manufacturer: [employer], [employer]   Lot: [id number]   NDC: [address]-8

## 2019-05-08 DIAGNOSIS — F39 Unspecified mood [affective] disorder: Secondary | ICD-10-CM | POA: Diagnosis not present

## 2019-05-08 DIAGNOSIS — F5105 Insomnia due to other mental disorder: Secondary | ICD-10-CM | POA: Diagnosis not present

## 2019-05-08 DIAGNOSIS — F900 Attention-deficit hyperactivity disorder, predominantly inattentive type: Secondary | ICD-10-CM | POA: Diagnosis not present

## 2019-05-08 DIAGNOSIS — F4011 Social phobia, generalized: Secondary | ICD-10-CM | POA: Diagnosis not present

## 2019-05-13 DIAGNOSIS — M25562 Pain in left knee: Secondary | ICD-10-CM | POA: Diagnosis not present

## 2019-05-21 DIAGNOSIS — R1084 Generalized abdominal pain: Secondary | ICD-10-CM | POA: Diagnosis not present

## 2019-05-21 DIAGNOSIS — J301 Allergic rhinitis due to pollen: Secondary | ICD-10-CM | POA: Diagnosis not present

## 2019-05-28 DIAGNOSIS — M25562 Pain in left knee: Secondary | ICD-10-CM | POA: Diagnosis not present

## 2019-05-30 DIAGNOSIS — Z20828 Contact with and (suspected) exposure to other viral communicable diseases: Secondary | ICD-10-CM | POA: Diagnosis not present

## 2019-05-30 DIAGNOSIS — Z03818 Encounter for observation for suspected exposure to other biological agents ruled out: Secondary | ICD-10-CM | POA: Diagnosis not present

## 2019-06-10 DIAGNOSIS — M25562 Pain in left knee: Secondary | ICD-10-CM | POA: Diagnosis not present

## 2019-08-05 DIAGNOSIS — F4011 Social phobia, generalized: Secondary | ICD-10-CM | POA: Diagnosis not present

## 2019-08-05 DIAGNOSIS — F900 Attention-deficit hyperactivity disorder, predominantly inattentive type: Secondary | ICD-10-CM | POA: Diagnosis not present

## 2019-08-05 DIAGNOSIS — F5105 Insomnia due to other mental disorder: Secondary | ICD-10-CM | POA: Diagnosis not present

## 2019-08-05 DIAGNOSIS — F39 Unspecified mood [affective] disorder: Secondary | ICD-10-CM | POA: Diagnosis not present

## 2019-09-14 DIAGNOSIS — M7989 Other specified soft tissue disorders: Secondary | ICD-10-CM | POA: Diagnosis not present

## 2019-09-14 DIAGNOSIS — M25572 Pain in left ankle and joints of left foot: Secondary | ICD-10-CM | POA: Diagnosis not present

## 2019-09-16 DIAGNOSIS — M7672 Peroneal tendinitis, left leg: Secondary | ICD-10-CM | POA: Diagnosis not present

## 2019-09-23 ENCOUNTER — Other Ambulatory Visit: Payer: Self-pay | Admitting: Podiatry

## 2019-09-23 DIAGNOSIS — M7672 Peroneal tendinitis, left leg: Secondary | ICD-10-CM

## 2019-09-29 DIAGNOSIS — M7672 Peroneal tendinitis, left leg: Secondary | ICD-10-CM | POA: Diagnosis not present

## 2019-10-07 DIAGNOSIS — M7672 Peroneal tendinitis, left leg: Secondary | ICD-10-CM | POA: Diagnosis not present

## 2019-10-13 ENCOUNTER — Ambulatory Visit
Admission: RE | Admit: 2019-10-13 | Discharge: 2019-10-13 | Disposition: A | Discharge status: Home / Self Care | Payer: BC Managed Care – PPO | Source: Ambulatory Visit | Attending: Podiatry | Admitting: Podiatry

## 2019-10-13 ENCOUNTER — Other Ambulatory Visit: Payer: Self-pay

## 2019-10-13 DIAGNOSIS — M7672 Peroneal tendinitis, left leg: Secondary | ICD-10-CM

## 2019-10-13 DIAGNOSIS — S99912A Unspecified injury of left ankle, initial encounter: Secondary | ICD-10-CM | POA: Diagnosis not present

## 2019-10-13 IMAGING — MR MR ANKLE*L* W/O CM
5 series · 40 of 40 positions shown · non-contrast
Comparison: None.

CLINICAL DATA: Injured ankle playing basketball 6 weeks ago.
Persistent lateral ankle pain.

EXAM:
MRI OF THE LEFT ANKLE WITHOUT CONTRAST
TECHNIQUE: Multiplanar, multisequence MR imaging of the ankle was performed. No
intravenous contrast was administered.

[Series 3: PD fat-sat · axial · left · 3.0mm · 0.50mm/px · z∈[-86,+53]mm · 10 of 36 slices shown]
[im 1/36]
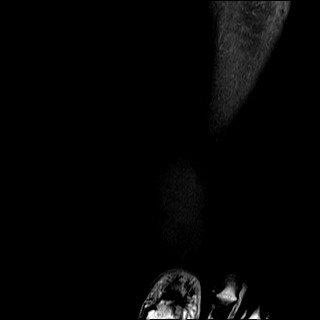
[im 4/36]
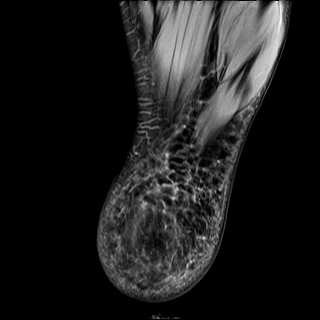
[im 8/36]
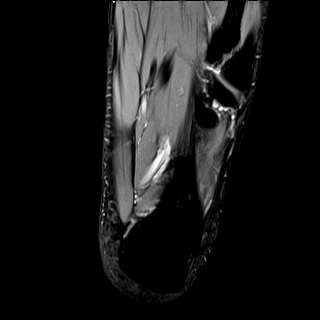
[im 12/36]
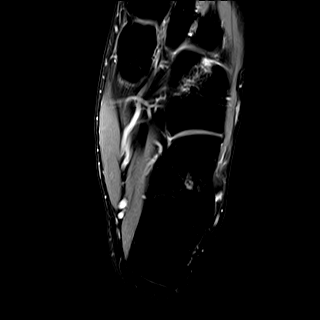
[im 16/36]
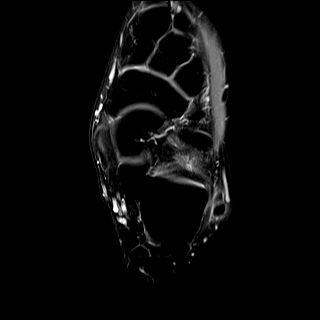
[im 20/36]
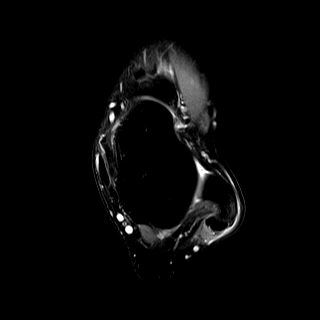
[im 24/36]
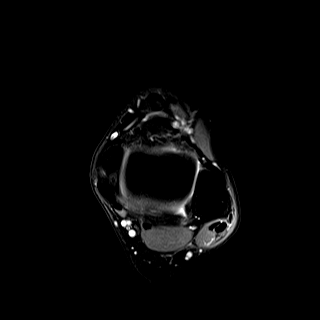
[im 28/36]
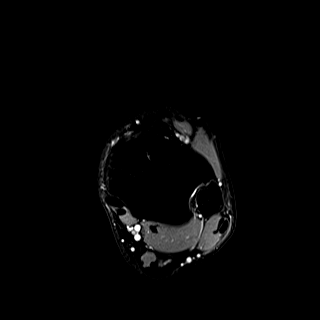
[im 32/36]
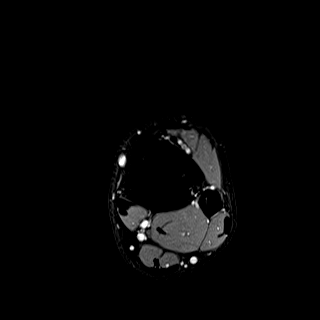
[im 36/36]
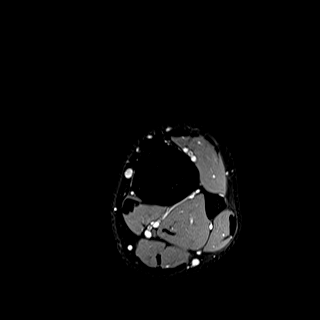

[Series 4: T2 fat-sat · axial · left · 3.0mm · 0.50mm/px · z∈[-86,+53]mm · 10 of 36 slices shown (1 of 2)]
[im 1/36]
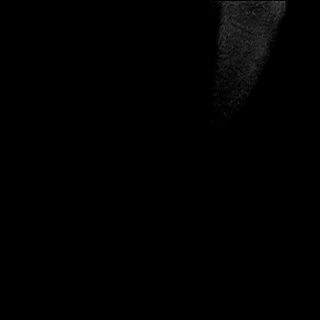
[im 4/36]
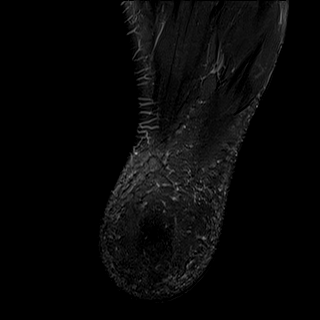
[im 8/36]
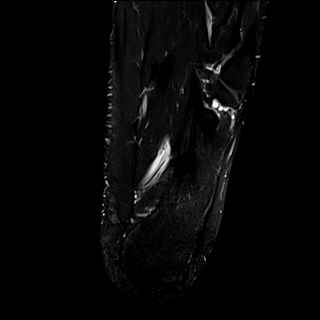
[im 12/36]
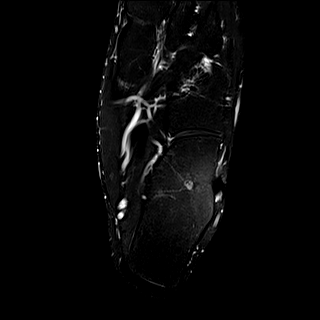
[im 16/36]
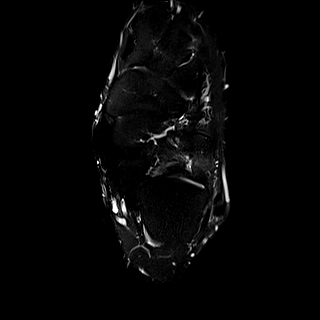
[im 20/36]
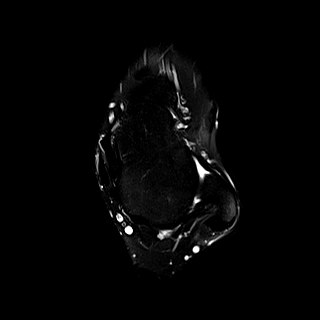
[im 24/36]
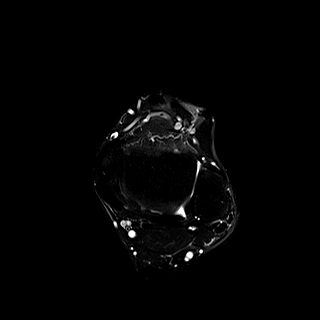
[im 28/36]
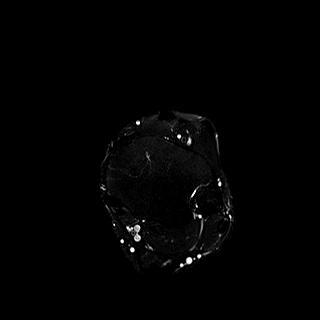
[im 32/36]
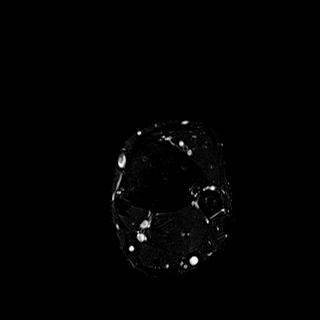
[im 36/36]
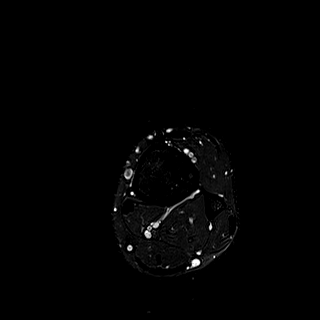

[Series 5: T2 fat-sat · coronal · left · 3.0mm · 0.62mm/px · 10 of 40 slices shown (2 of 2)]
[im 1/40]
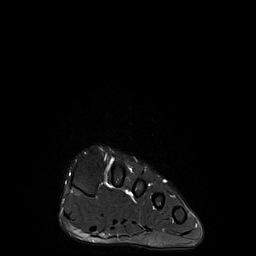
[im 5/40]
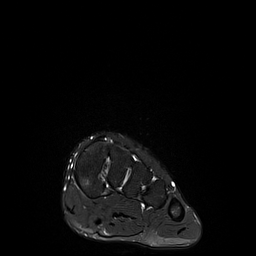
[im 9/40]
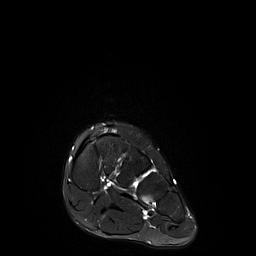
[im 14/40]
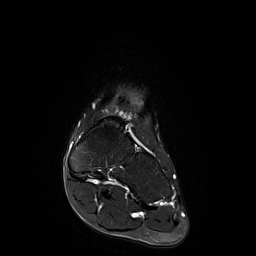
[im 18/40]
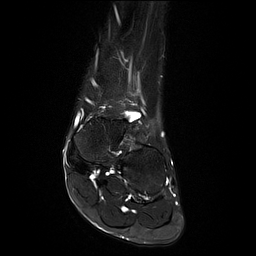
[im 22/40]
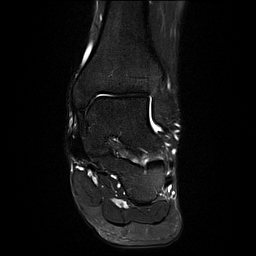
[im 27/40]
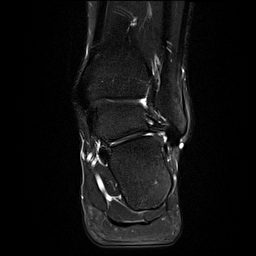
[im 31/40]
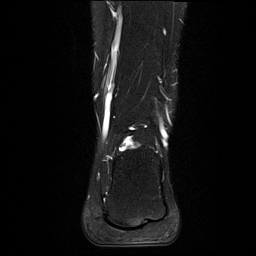
[im 35/40]
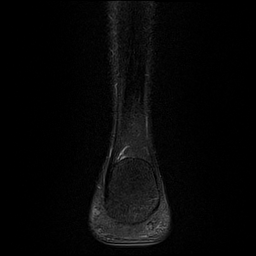
[im 40/40]
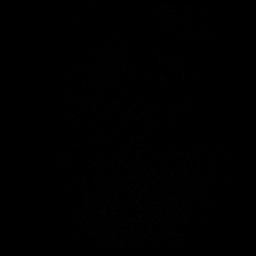

[Series 6: T1 · sagittal · left · 4.0mm · 0.70mm/px · 5 of 21 slices shown]
[im 1/21]
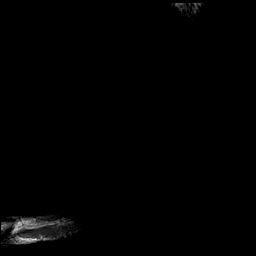
[im 6/21]
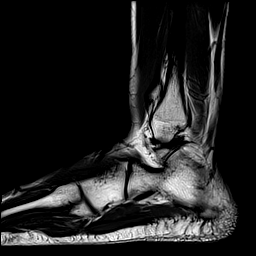
[im 11/21]
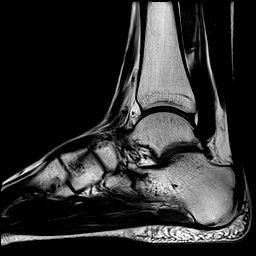
[im 16/21]
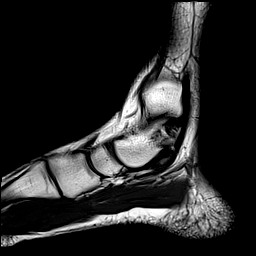
[im 21/21]
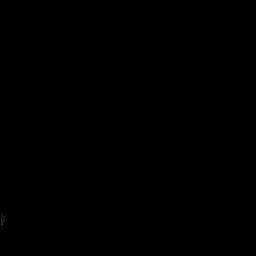

[Series 7: STIR · sagittal · left · 4.0mm · 0.35mm/px · 5 of 21 slices shown]
[im 1/21]
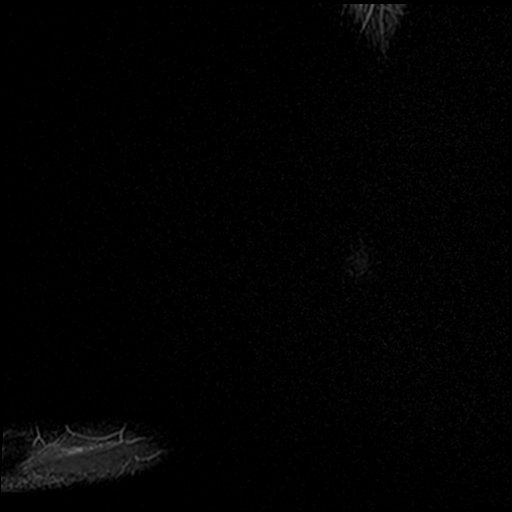
[im 6/21]
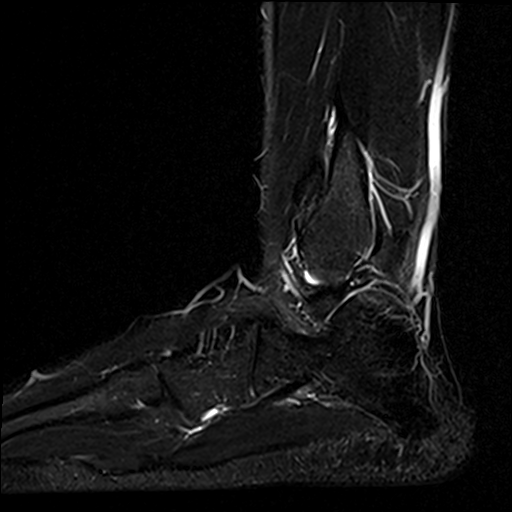
[im 11/21]
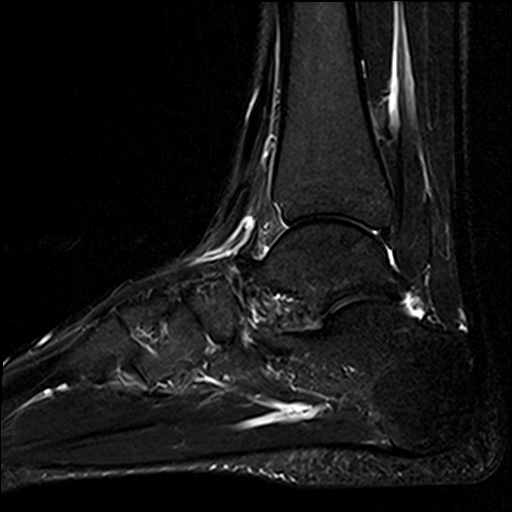
[im 16/21]
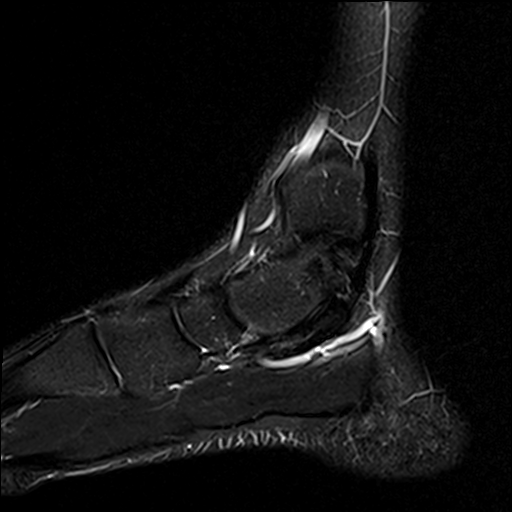
[im 21/21]
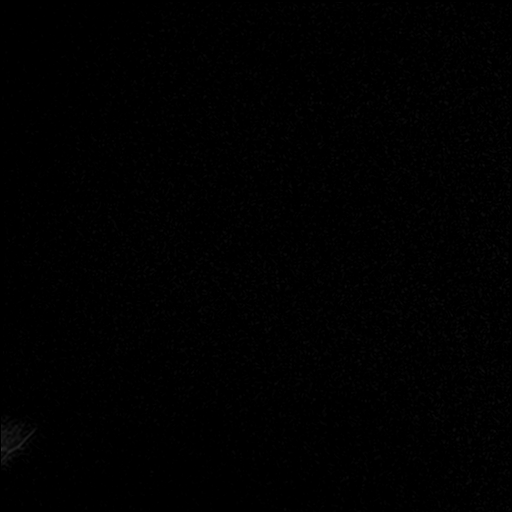

[40 of 40 positions shown; findings below may reference images not displayed]

FINDINGS: TENDONS

Peroneal: Intact.  No tendinopathy or tenosynovitis.

Posteromedial: Intact.  No tendinopathy or tenosynovitis.

Anterior: Intact.  No tendinopathy or tenosynovitis.

Achilles: Normal

Plantar Fascia: Intact

LIGAMENTS

Lateral: Intact

Medial: Intact

CARTILAGE

Ankle Joint: Normal appearing articular cartilage. No joint effusion
or osteochondral lesion.

Subtalar Joints/Sinus Tarsi: The subtalar joints are maintained. The
sinus tarsi is normal. The cervical and interosseous ligaments are
intact. The spring ligament is intact.

Bones: No bone contusion, marrow edema or fracture.

Other: Unremarkable foot and ankle musculature.
IMPRESSION: Unremarkable MR examination of the ankle. No acute bony injury,
ligament tear or tendon injury identified.

## 2019-10-27 DIAGNOSIS — M7672 Peroneal tendinitis, left leg: Secondary | ICD-10-CM | POA: Diagnosis not present

## 2019-11-03 DIAGNOSIS — M25562 Pain in left knee: Secondary | ICD-10-CM | POA: Diagnosis not present

## 2019-11-04 DIAGNOSIS — F39 Unspecified mood [affective] disorder: Secondary | ICD-10-CM | POA: Diagnosis not present

## 2019-11-04 DIAGNOSIS — F900 Attention-deficit hyperactivity disorder, predominantly inattentive type: Secondary | ICD-10-CM | POA: Diagnosis not present

## 2019-11-04 DIAGNOSIS — F4011 Social phobia, generalized: Secondary | ICD-10-CM | POA: Diagnosis not present

## 2019-11-04 DIAGNOSIS — F5105 Insomnia due to other mental disorder: Secondary | ICD-10-CM | POA: Diagnosis not present

## 2019-11-10 DIAGNOSIS — M7672 Peroneal tendinitis, left leg: Secondary | ICD-10-CM | POA: Diagnosis not present

## 2019-11-17 DIAGNOSIS — M7672 Peroneal tendinitis, left leg: Secondary | ICD-10-CM | POA: Diagnosis not present

## 2019-11-19 DIAGNOSIS — M7672 Peroneal tendinitis, left leg: Secondary | ICD-10-CM | POA: Diagnosis not present

## 2019-11-27 DIAGNOSIS — M7672 Peroneal tendinitis, left leg: Secondary | ICD-10-CM | POA: Diagnosis not present

## 2019-12-03 DIAGNOSIS — M7672 Peroneal tendinitis, left leg: Secondary | ICD-10-CM | POA: Diagnosis not present

## 2019-12-14 DIAGNOSIS — M7672 Peroneal tendinitis, left leg: Secondary | ICD-10-CM | POA: Diagnosis not present

## 2019-12-21 DIAGNOSIS — M7672 Peroneal tendinitis, left leg: Secondary | ICD-10-CM | POA: Diagnosis not present

## 2019-12-25 DIAGNOSIS — M7672 Peroneal tendinitis, left leg: Secondary | ICD-10-CM | POA: Diagnosis not present

## 2019-12-28 DIAGNOSIS — M7672 Peroneal tendinitis, left leg: Secondary | ICD-10-CM | POA: Diagnosis not present

## 2019-12-30 DIAGNOSIS — M7672 Peroneal tendinitis, left leg: Secondary | ICD-10-CM | POA: Diagnosis not present

## 2020-01-04 DIAGNOSIS — M7672 Peroneal tendinitis, left leg: Secondary | ICD-10-CM | POA: Diagnosis not present
